# Patient Record
Sex: Male | Born: 1950 | Race: White | Hispanic: No | Marital: Married | Smoking: Former smoker
Health system: Southern US, Community
[De-identification: ages and names within clinical notes are randomized; demographics above are authoritative.]

## PROBLEM LIST (undated history)

## (undated) DIAGNOSIS — E785 Hyperlipidemia, unspecified: Secondary | ICD-10-CM

## (undated) DIAGNOSIS — I10 Essential (primary) hypertension: Secondary | ICD-10-CM

## (undated) DIAGNOSIS — M1990 Unspecified osteoarthritis, unspecified site: Secondary | ICD-10-CM

## (undated) DIAGNOSIS — E119 Type 2 diabetes mellitus without complications: Secondary | ICD-10-CM

## (undated) DIAGNOSIS — R011 Cardiac murmur, unspecified: Secondary | ICD-10-CM

## (undated) HISTORY — DX: Cardiac murmur, unspecified: R01.1

## (undated) HISTORY — PX: NO PAST SURGERIES: SHX2092

## (undated) HISTORY — DX: Hyperlipidemia, unspecified: E78.5

## (undated) HISTORY — DX: Unspecified osteoarthritis, unspecified site: M19.90

## (undated) HISTORY — DX: Essential (primary) hypertension: I10

## (undated) HISTORY — DX: Type 2 diabetes mellitus without complications: E11.9

---

## 2011-01-08 DIAGNOSIS — I10 Essential (primary) hypertension: Secondary | ICD-10-CM | POA: Insufficient documentation

## 2017-01-17 DIAGNOSIS — I491 Atrial premature depolarization: Secondary | ICD-10-CM | POA: Insufficient documentation

## 2018-01-01 DIAGNOSIS — R011 Cardiac murmur, unspecified: Secondary | ICD-10-CM | POA: Insufficient documentation

## 2018-01-15 LAB — LIPID PANEL
Cholesterol: 104 (ref 0–200)
HDL: 35 (ref 35–70)
LDL Cholesterol: 35
Triglycerides: 172 — AB (ref 40–160)

## 2018-01-15 LAB — BASIC METABOLIC PANEL
BUN: 18 (ref 4–21)
Creatinine: 1 (ref 0.6–1.3)
Glucose: 98
Potassium: 4 (ref 3.4–5.3)
Sodium: 141 (ref 137–147)

## 2018-01-15 LAB — HM HEPATITIS C SCREENING LAB: HM Hepatitis Screen: NEGATIVE

## 2018-02-20 DIAGNOSIS — I340 Nonrheumatic mitral (valve) insufficiency: Secondary | ICD-10-CM | POA: Insufficient documentation

## 2018-02-20 DIAGNOSIS — I361 Nonrheumatic tricuspid (valve) insufficiency: Secondary | ICD-10-CM | POA: Insufficient documentation

## 2018-02-20 DIAGNOSIS — I38 Endocarditis, valve unspecified: Secondary | ICD-10-CM | POA: Insufficient documentation

## 2018-05-09 LAB — MICROALBUMIN, URINE: Microalb, Ur: 59.6

## 2018-05-09 LAB — HEMOGLOBIN A1C: Hemoglobin A1C: 7.3

## 2018-05-19 DIAGNOSIS — I371 Nonrheumatic pulmonary valve insufficiency: Secondary | ICD-10-CM | POA: Insufficient documentation

## 2018-09-08 LAB — HM COLONOSCOPY

## 2019-02-26 ENCOUNTER — Other Ambulatory Visit: Payer: Self-pay

## 2019-02-26 ENCOUNTER — Encounter: Payer: Self-pay | Admitting: Family Medicine

## 2019-02-26 ENCOUNTER — Ambulatory Visit: Payer: Managed Care, Other (non HMO) | Admitting: Family Medicine

## 2019-02-26 VITALS — BP 144/78 | HR 104 | Temp 97.4°F | Ht 68.5 in | Wt 268.5 lb

## 2019-02-26 DIAGNOSIS — E785 Hyperlipidemia, unspecified: Secondary | ICD-10-CM

## 2019-02-26 DIAGNOSIS — E1100 Type 2 diabetes mellitus with hyperosmolarity without nonketotic hyperglycemic-hyperosmolar coma (NKHHC): Secondary | ICD-10-CM | POA: Diagnosis not present

## 2019-02-26 DIAGNOSIS — M25552 Pain in left hip: Secondary | ICD-10-CM | POA: Diagnosis not present

## 2019-02-26 DIAGNOSIS — R748 Abnormal levels of other serum enzymes: Secondary | ICD-10-CM | POA: Insufficient documentation

## 2019-02-26 DIAGNOSIS — I10 Essential (primary) hypertension: Secondary | ICD-10-CM

## 2019-02-26 LAB — HEMOGLOBIN A1C: Hgb A1c MFr Bld: 8.3 % — ABNORMAL HIGH (ref 4.6–6.5)

## 2019-02-26 LAB — COMPREHENSIVE METABOLIC PANEL
ALT: 43 U/L (ref 0–53)
AST: 28 U/L (ref 0–37)
Albumin: 4.4 g/dL (ref 3.5–5.2)
Alkaline Phosphatase: 60 U/L (ref 39–117)
BUN: 18 mg/dL (ref 6–23)
CO2: 25 mEq/L (ref 19–32)
Calcium: 9.7 mg/dL (ref 8.4–10.5)
Chloride: 103 mEq/L (ref 96–112)
Creatinine, Ser: 1.12 mg/dL (ref 0.40–1.50)
GFR: 65.4 mL/min (ref >60.00)
Glucose, Bld: 128 mg/dL — ABNORMAL HIGH (ref 70–99)
Potassium: 4.1 mEq/L (ref 3.5–5.1)
Sodium: 139 mEq/L (ref 135–145)
Total Bilirubin: 0.6 mg/dL (ref 0.2–1.2)
Total Protein: 7 g/dL (ref 6.0–8.3)

## 2019-02-26 LAB — LIPID PANEL
Cholesterol: 139 mg/dL (ref 0–200)
HDL: 48.4 mg/dL (ref >39.00)
NonHDL: 90.52
Total CHOL/HDL Ratio: 3
Triglycerides: 239 mg/dL — ABNORMAL HIGH (ref 0.0–149.0)
VLDL: 47.8 mg/dL — ABNORMAL HIGH (ref 0.0–40.0)

## 2019-02-26 LAB — LDL CHOLESTEROL, DIRECT: Direct LDL: 66 mg/dL

## 2019-02-26 LAB — CK: Total CK: 384 U/L — ABNORMAL HIGH (ref 7–232)

## 2019-02-26 MED ORDER — METOPROLOL SUCCINATE ER 25 MG PO TB24: 25.0000 mg | ORAL_TABLET | Freq: Every day | ORAL | 3 refills | Status: DC

## 2019-02-26 NOTE — Assessment & Plan Note (Signed)
DDx arthritis, bursitis, and piriformis syndrome. Given diabetes would like to avoid steroid injection if possible. Advised PT if patient can schedule. Hand out for piriformis exercises to try at home. Return if worsening no improvement - consider XR and steroid if more bursitis picture.

## 2019-02-26 NOTE — Assessment & Plan Note (Signed)
BP mildly elevated. Will get labs today and encouraged diet/exercise.

## 2019-02-26 NOTE — Patient Instructions (Signed)
Great to meet you today!  Get blood work today.   I would recommend physical therapy  Hand out with exercises
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
## 2019-02-26 NOTE — Assessment & Plan Note (Signed)
Noted to be >300 for a few years. Unclear why this was initially checked and patient unaware of diagnosis. Will repeat today as on statin to make sure not worsening.

## 2019-02-26 NOTE — Assessment & Plan Note (Signed)
Encouraged following diet. Will check HgbA1c today.

## 2019-03-04 ENCOUNTER — Encounter: Payer: Self-pay | Admitting: Gastroenterology

## 2019-03-10 ENCOUNTER — Other Ambulatory Visit: Payer: Self-pay

## 2019-03-10 MED ORDER — METFORMIN HCL 500 MG PO TABS: 500.0000 mg | ORAL_TABLET | Freq: Two times a day (BID) | ORAL | 2 refills | Status: AC

## 2019-04-08 ENCOUNTER — Telehealth: Payer: Self-pay | Admitting: *Deleted

## 2019-04-08 NOTE — Telephone Encounter (Signed)
Can discuss tomorrow at visit.
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
## 2019-04-08 NOTE — Telephone Encounter (Signed)
Left message for patient to call back to be advised 

## 2019-04-09 ENCOUNTER — Telehealth: Payer: Self-pay

## 2019-04-09 ENCOUNTER — Encounter: Payer: Self-pay | Admitting: Family Medicine

## 2019-04-09 ENCOUNTER — Ambulatory Visit: Payer: Managed Care, Other (non HMO) | Admitting: Family Medicine

## 2019-04-09 ENCOUNTER — Other Ambulatory Visit: Payer: Self-pay

## 2019-04-09 VITALS — BP 168/60 | HR 72 | Temp 98.0°F | Resp 10 | Ht 68.5 in | Wt 273.0 lb

## 2019-04-09 DIAGNOSIS — S161XXA Strain of muscle, fascia and tendon at neck level, initial encounter: Secondary | ICD-10-CM | POA: Diagnosis not present

## 2019-04-09 DIAGNOSIS — E1100 Type 2 diabetes mellitus with hyperosmolarity without nonketotic hyperglycemic-hyperosmolar coma (NKHHC): Secondary | ICD-10-CM

## 2019-04-09 DIAGNOSIS — I10 Essential (primary) hypertension: Secondary | ICD-10-CM

## 2019-04-09 DIAGNOSIS — I491 Atrial premature depolarization: Secondary | ICD-10-CM

## 2019-04-09 DIAGNOSIS — I499 Cardiac arrhythmia, unspecified: Secondary | ICD-10-CM | POA: Insufficient documentation

## 2019-04-09 MED ORDER — GABAPENTIN 300 MG PO CAPS: 300.0000 mg | ORAL_CAPSULE | Freq: Every day | ORAL | 3 refills | Status: DC

## 2019-04-09 MED ORDER — SITAGLIPTIN PHOSPHATE 100 MG PO TABS: 100.0000 mg | ORAL_TABLET | Freq: Every day | ORAL | 3 refills | Status: AC

## 2019-04-09 MED ORDER — METOPROLOL SUCCINATE ER 50 MG PO TB24: 50.0000 mg | ORAL_TABLET | Freq: Every day | ORAL | 3 refills | Status: AC
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
## 2019-04-09 NOTE — Patient Instructions (Signed)
#   High Blood pressure - increase metoprolol 50 mg - Call if you notice your blood pressure is still >150/90   Your blood pressure high.   High blood pressure increases your risk for heart attack and stroke.    Please check your blood pressure 2-4 times a week.   To check your blood pressure 1) Sit in a quiet and relaxed place for 5 minutes 2) Make sure your feet are flat on the ground 3) Consider checking first thing in the morning   Normal blood pressure is less than 140/90 Ideally you blood pressure should be around 120/80
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
## 2019-04-09 NOTE — Assessment & Plan Note (Addendum)
Pt with atrial bigeminity on EKG and frequent PAC. Reviewed cardiology note from Jan 2020 - they recommended 2 month f/u and decreased metoprolol last year. Discussed with patient and he is OK with increase in metoprolol - advised returning to cardiology given finding but he declined. Would like to see if there is improvement on high dose of metoprolol. HR 68 today so will need to monitor HR prior to any other increases. Of note in cardiology note they discussed sleep apnea as possible risk. Will send patient mychart to advise referral.

## 2019-04-09 NOTE — Assessment & Plan Note (Signed)
Continued to encourage diet/exercise. Cont medication return 2 months

## 2019-04-09 NOTE — Telephone Encounter (Signed)
Pt left v/m would be by around 3:15 today to pick up work badge.

## 2019-04-09 NOTE — Telephone Encounter (Signed)
I left message for patient letting him know that he left his work badge in the room. I have this on my desk when he comes back by.

## 2019-04-21 ENCOUNTER — Other Ambulatory Visit: Payer: Self-pay

## 2019-04-21 MED ORDER — SIMVASTATIN 20 MG PO TABS: 20.0000 mg | ORAL_TABLET | Freq: Every day | ORAL | 1 refills | Status: DC

## 2019-06-02 ENCOUNTER — Ambulatory Visit: Payer: Managed Care, Other (non HMO) | Admitting: Family Medicine

## 2019-06-08 ENCOUNTER — Other Ambulatory Visit: Payer: Self-pay

## 2019-06-08 ENCOUNTER — Ambulatory Visit: Payer: Managed Care, Other (non HMO) | Admitting: Family Medicine

## 2019-06-08 ENCOUNTER — Encounter: Payer: Self-pay | Admitting: Family Medicine

## 2019-06-08 VITALS — BP 138/56 | HR 50 | Temp 97.8°F | Resp 24 | Ht 68.5 in | Wt 276.8 lb

## 2019-06-08 DIAGNOSIS — L608 Other nail disorders: Secondary | ICD-10-CM

## 2019-06-08 DIAGNOSIS — G4733 Obstructive sleep apnea (adult) (pediatric): Secondary | ICD-10-CM

## 2019-06-08 DIAGNOSIS — E1100 Type 2 diabetes mellitus with hyperosmolarity without nonketotic hyperglycemic-hyperosmolar coma (NKHHC): Secondary | ICD-10-CM

## 2019-06-08 DIAGNOSIS — B351 Tinea unguium: Secondary | ICD-10-CM

## 2019-06-08 DIAGNOSIS — I10 Essential (primary) hypertension: Secondary | ICD-10-CM

## 2019-06-08 LAB — POCT GLYCOSYLATED HEMOGLOBIN (HGB A1C): Hemoglobin A1C: 7.7 % — AB (ref 4.0–5.6)

## 2019-06-08 LAB — MICROALBUMIN / CREATININE URINE RATIO
Creatinine,U: 107.9 mg/dL
Microalb Creat Ratio: 23 mg/g (ref 0.0–30.0)
Microalb, Ur: 24.8 mg/dL — ABNORMAL HIGH (ref 0.0–1.9)

## 2019-06-08 NOTE — Patient Instructions (Addendum)
#  Diabetes - Metformin increase Tomorrow: Take 1000 mg (2 tablets) in the morning and 500 mg in the evening with food Week 2: Take 1000 mg (2 tablets) in the morning and evening with food  - let me know what dose you get to for refills  Return in 3 months for an appointment.   #Blood pressure - continue current medications - sleep apnea referral  #Referral I have placed a referral to a specialist for you. You should receive a phone call from the specialty office. Make sure your voicemail is not full and that if you are able to answer your phone to unknown or new numbers.   It may take up to 2 weeks to hear about the referral. If you do not hear anything in 2 weeks, please call our office and ask to speak with the referral coordinator.    Stop the meloxicam daily -- can take as needed for pain
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
## 2019-06-08 NOTE — Assessment & Plan Note (Signed)
Improved but still elevated. Increase metformin as tolerated to 1000 mg BID. Cont Januvia. Recheck 3 months

## 2019-06-08 NOTE — Progress Notes (Signed)
Subjective:     Ricardo Jacobs is a 69 y.o. male presenting for Diabetes (follow up) and Hypertension (follow up)     HPI   #HTN - took the higher dose of metoprolol and amlodpine - no palpations, no cp - had been evaluated for sleep apnea about 10 years ago and had a machine and he stopped due to feeling better - sleep is fine now - no issues with sleepiness during the day - working to reduce salt   #Diabetes Currently taking metformin 500 mg BID and januvia  Using medications without difficulties: No Hypoglycemic episodes:No  Hyperglycemic episodes:No  Feet problems: hx of tingling but none currently Diet: tries to follow low sugar diet Last HgbA1c:  Lab Results  Component Value Date   HGBA1C 7.7 (A) 06/08/2019   Black nail: x 1 month  Diabetes Health Maintenance Due:    Diabetes Health Maintenance Due  Topic Date Due  . OPHTHALMOLOGY EXAM  Never done  . URINE MICROALBUMIN  05/09/2019  . HEMOGLOBIN A1C  12/08/2019  . FOOT EXAM  06/07/2020      Review of Systems  Chart review 04/09/2019: Clinic - HTN - increase metoprolol due to PACs. DM - januvia. Atrial bigeminity - encouraged cards, but declined. Increase metoprolol.   Social History   Tobacco Use  Smoking Status Former Smoker  . Packs/day: 1.00  . Years: 8.00  . Pack years: 8.00  . Types: Cigarettes, Cigars  . Quit date: 80  . Years since quitting: 36.3  Smokeless Tobacco Never Used        Objective:    BP Readings from Last 3 Encounters:  06/08/19 (!) 138/56  04/09/19 (!) 168/60  02/26/19 (!) 144/78   Wt Readings from Last 3 Encounters:  06/08/19 276 lb 12 oz (125.5 kg)  04/09/19 273 lb (123.8 kg)  02/26/19 268 lb 8 oz (121.8 kg)    BP (!) 138/56   Pulse (!) 50   Temp 97.8 F (36.6 C)   Resp (!) 24   Ht 5' 8.5" (1.74 m)   Wt 276 lb 12 oz (125.5 kg)   SpO2 96%   BMI 41.47 kg/m    Physical Exam Constitutional:      Appearance: Normal appearance. He is not  ill-appearing or diaphoretic.  HENT:     Right Ear: External ear normal.     Left Ear: External ear normal.     Nose: Nose normal.  Eyes:     General: No scleral icterus.    Extraocular Movements: Extraocular movements intact.     Conjunctiva/sclera: Conjunctivae normal.  Cardiovascular:     Rate and Rhythm: Normal rate and regular rhythm.     Heart sounds: No murmur.  Pulmonary:     Effort: Pulmonary effort is normal. No respiratory distress.     Breath sounds: Normal breath sounds. No wheezing.  Musculoskeletal:     Cervical back: Neck supple.  Skin:    General: Skin is warm and dry.  Neurological:     Mental Status: He is alert. Mental status is at baseline.  Psychiatric:        Mood and Affect: Mood normal.           Assessment & Plan:   Problem List Items Addressed This Visit      Cardiovascular and Mediastinum   HTN (hypertension)    BP elevated but palpitations improved. Stop NSAID. Sleep apnea evaluation and return if 2 months. Discussed adding another medication today, but  he declined. If unable to get sleep apnea eval quickly will likely add medication. Cont metoprolol and amlodipine.         Respiratory   Obstructive sleep apnea syndrome    Hx and treatment 10 years ago. Suspect this could be worsening HTN. Advised return evaluation and treatment. Referral placed given length of time.       Relevant Orders   Ambulatory referral to Pulmonology     Endocrine   Type 2 diabetes mellitus with hyperosmolarity without coma, without long-term current use of insulin (HCC) - Primary    Improved but still elevated. Increase metformin as tolerated to 1000 mg BID. Cont Januvia. Recheck 3 months      Relevant Orders   POCT glycosylated hemoglobin (Hb A1C) (Completed)   Microalbumin/Creatinine Ratio, Urine     Musculoskeletal and Integument   Onychomycosis    Not bothersome and not interested in treatment.         Other   Black nails    No know trauma but  as only present x 1 month, advised monitoring and if not resolved in 2 months to call and can do dermatology referral           Return in about 3 months (around 09/07/2019) for DM and HTN.  Lynnda Child, MD

## 2019-06-08 NOTE — Assessment & Plan Note (Signed)
BP elevated but palpitations improved. Stop NSAID. Sleep apnea evaluation and return if 2 months. Discussed adding another medication today, but he declined. If unable to get sleep apnea eval quickly will likely add medication. Cont metoprolol and amlodipine.

## 2019-06-08 NOTE — Assessment & Plan Note (Signed)
No know trauma but as only present x 1 month, advised monitoring and if not resolved in 2 months to call and can do dermatology referral

## 2019-06-08 NOTE — Assessment & Plan Note (Signed)
Not bothersome and not interested in treatment.

## 2019-09-07 ENCOUNTER — Encounter: Payer: Self-pay | Admitting: Family Medicine

## 2019-09-07 ENCOUNTER — Ambulatory Visit: Payer: Managed Care, Other (non HMO) | Admitting: Family Medicine

## 2019-09-07 ENCOUNTER — Other Ambulatory Visit: Payer: Self-pay

## 2019-09-07 VITALS — BP 136/60 | HR 101 | Temp 97.6°F | Wt 271.2 lb

## 2019-09-07 DIAGNOSIS — M25572 Pain in left ankle and joints of left foot: Secondary | ICD-10-CM

## 2019-09-07 DIAGNOSIS — G8929 Other chronic pain: Secondary | ICD-10-CM

## 2019-09-07 DIAGNOSIS — I10 Essential (primary) hypertension: Secondary | ICD-10-CM | POA: Diagnosis not present

## 2019-09-07 DIAGNOSIS — E1100 Type 2 diabetes mellitus with hyperosmolarity without nonketotic hyperglycemic-hyperosmolar coma (NKHHC): Secondary | ICD-10-CM | POA: Diagnosis not present

## 2019-09-07 LAB — POCT GLYCOSYLATED HEMOGLOBIN (HGB A1C): Hemoglobin A1C: 8.4 % — AB (ref 4.0–5.6)

## 2019-09-07 NOTE — Assessment & Plan Note (Signed)
Worse. Discussed that next agent may need to be injectable or insulin. Pt working on weight loss but discussed need for low carb diabetic diet. Emphasis on nutrition - he declined referral. He will work to improve diet and follow-up in 3 months. Cont Januvia, Metformin, glipizide. May benefit from Oxempic as additional agent

## 2019-09-07 NOTE — Assessment & Plan Note (Signed)
Some ttp but no known injury. Suspect sprain or arthritis. Advised topical NSAID and PT if not improving. Consider XR but given intermittent symptoms and no injury decided to defer

## 2019-09-07 NOTE — Progress Notes (Signed)
Subjective:     Ricardo Jacobs is a 69 y.o. male presenting for Follow-up (3 month- DM) and Leg Pain (left leg from hip to ankle )     HPI   #Diabetes Currently taking Metformin 1000 mg BID, glipizide 5 mg BID, and Januvia 100 mg Diet: tries to watch what he eats - does eat salad, is eating carbs  Using medications without difficulties: No Hypoglycemic episodes:No  Hyperglycemic episodes:No  Feet problems:No  Blood Sugars averaging: does not check at home Last HgbA1c:  Recent Labs       Lab Results  Component Value Date   HGBA1C 8.4 (A) 09/07/2019     Has cut back on fastfood places  Diabetes Health Maintenance Due:        Diabetes Health Maintenance Due  Topic Date Due  . OPHTHALMOLOGY EXAM  Never done  . HEMOGLOBIN A1C  03/09/2020  . FOOT EXAM  06/07/2020  . URINE MICROALBUMIN  06/07/2020    Left leg pain - starts at the ankle and travels to the buttock - denies back pain - does have some back stiffness in the AM - tries to baby this leg to avoid symptoms  - leg pain - denies knee pain - pain is shooting in nature and quick on and quick off when it occurs - will take ibuprofen or ASA occasionally which will help the symptoms - no weakness/tingling/numbness   Review of Systems  06/08/2019: Clinic - HTN and Bradycardia - Sleep apnea referral. DM  - 7.7 increase metformin Social History   Tobacco Use  Smoking Status Former Smoker  . Packs/day: 1.00  . Years: 8.00  . Pack years: 8.00  . Types: Cigarettes, Cigars  . Quit date: 32  . Years since quitting: 36.5  Smokeless Tobacco Never Used        Objective:    BP Readings from Last 3 Encounters:  09/07/19 (!) 136/60  06/08/19 (!) 138/56  04/09/19 (!) 168/60   Wt Readings from Last 3 Encounters:  09/07/19 (!) 271 lb 4 oz (123 kg)  06/08/19 276 lb 12 oz (125.5 kg)  04/09/19 273 lb (123.8 kg)    BP (!) 136/60   Pulse 101   Temp 97.6 F (36.4 C) (Temporal)   Wt (!) 271 lb 4 oz  (123 kg)   SpO2 96%   BMI 40.64 kg/m    Physical Exam Constitutional:      Appearance: Normal appearance. He is obese. He is not ill-appearing or diaphoretic.  HENT:     Right Ear: External ear normal.     Left Ear: External ear normal.  Eyes:     General: No scleral icterus.    Extraocular Movements: Extraocular movements intact.     Conjunctiva/sclera: Conjunctivae normal.  Cardiovascular:     Rate and Rhythm: Normal rate.  Pulmonary:     Effort: Pulmonary effort is normal.  Musculoskeletal:     Cervical back: Neck supple.     Comments: B/l LE trace edema. Left ankle - TTP along the medial malleolus and up the leg. Normal ROM and strength. No erythema or bruising.   Skin:    General: Skin is warm and dry.  Neurological:     Mental Status: He is alert. Mental status is at baseline.  Psychiatric:        Mood and Affect: Mood normal.           Assessment & Plan:   Problem List Items Addressed This Visit  Cardiovascular and Mediastinum   HTN (hypertension)    Stable on amlodipine. May need to discuss switching to ACE-I for kidney protection. Limit NSAIDs        Endocrine   Type 2 diabetes mellitus with hyperosmolarity without coma, without long-term current use of insulin (HCC) - Primary    Worse. Discussed that next agent may need to be injectable or insulin. Pt working on weight loss but discussed need for low carb diabetic diet. Emphasis on nutrition - he declined referral. He will work to improve diet and follow-up in 3 months. Cont Januvia, Metformin, glipizide. May benefit from Oxempic as additional agent      Relevant Orders   POCT glycosylated hemoglobin (Hb A1C) (Completed)     Other   Chronic pain of left ankle    Some ttp but no known injury. Suspect sprain or arthritis. Advised topical NSAID and PT if not improving. Consider XR but given intermittent symptoms and no injury decided to defer      Relevant Medications   ibuprofen (ADVIL) 400 MG  tablet       Return in about 3 months (around 12/08/2019).  Lynnda Child, MD  This visit occurred during the SARS-CoV-2 public health emergency.  Safety protocols were in place, including screening questions prior to the visit, additional usage of staff PPE, and extensive cleaning of exam room while observing appropriate contact time as indicated for disinfecting solutions.

## 2019-09-07 NOTE — Patient Instructions (Signed)
#  Diabetes  - go over your resources and see how you can decrease carbs in your diet - continue medications - return in 3 months - call if you decide you want a nutritionist appointment  #Leg pain - try voltaren gel - if no improvement in 1 month call and will refer to physical therapy

## 2019-09-07 NOTE — Assessment & Plan Note (Signed)
Stable on amlodipine. May need to discuss switching to ACE-I for kidney protection. Limit NSAIDs

## 2019-10-01 ENCOUNTER — Ambulatory Visit: Payer: Managed Care, Other (non HMO) | Admitting: Family Medicine

## 2019-10-01 ENCOUNTER — Other Ambulatory Visit: Payer: Self-pay

## 2019-10-01 ENCOUNTER — Ambulatory Visit (INDEPENDENT_AMBULATORY_CARE_PROVIDER_SITE_OTHER)
Admission: RE | Admit: 2019-10-01 | Discharge: 2019-10-01 | Disposition: A | Discharge status: Home / Self Care | Payer: Managed Care, Other (non HMO) | Source: Ambulatory Visit | Attending: Family Medicine | Admitting: Family Medicine

## 2019-10-01 ENCOUNTER — Encounter: Payer: Self-pay | Admitting: Family Medicine

## 2019-10-01 VITALS — BP 158/60 | HR 58 | Temp 97.8°F | Wt 278.5 lb

## 2019-10-01 DIAGNOSIS — E1100 Type 2 diabetes mellitus with hyperosmolarity without nonketotic hyperglycemic-hyperosmolar coma (NKHHC): Secondary | ICD-10-CM

## 2019-10-01 DIAGNOSIS — M5442 Lumbago with sciatica, left side: Secondary | ICD-10-CM

## 2019-10-01 MED ORDER — GABAPENTIN 300 MG PO CAPS: 300.0000 mg | ORAL_CAPSULE | Freq: Every day | ORAL | 3 refills | Status: AC

## 2019-10-01 MED ORDER — CYCLOBENZAPRINE HCL 5 MG PO TABS: 5.0000 mg | ORAL_TABLET | Freq: Three times a day (TID) | ORAL | 1 refills | Status: AC | PRN

## 2019-10-01 MED ORDER — PREDNISONE 20 MG PO TABS: ORAL_TABLET | ORAL | 0 refills | Status: AC

## 2019-10-01 NOTE — Patient Instructions (Signed)
Do not take meloxicam or Ibuprofen or aleve when on prednisone  Muscle relaxer -- for muscle spasms, tightening muscles -- try at nighttime can make sleepy  Prednisone - take daily in the morning for 15 days  X-ray before you leave  If you develop - new or worsening weakness, loss of bowel or bladder control -- call as this is an emergency   Physical therapy referral

## 2019-10-01 NOTE — Assessment & Plan Note (Signed)
Refill on gabapentin °

## 2019-10-01 NOTE — Progress Notes (Signed)
Subjective:     Ricardo Jacobs is a 69 y.o. male presenting for Back Pain (lumbar x 6 week ) and Leg Pain (left x 6 weeks )     HPI   #Left leg pain - from the hip down - cannot walk at the store due to severe pain - improves with pushing a shopping cart - lumbar back pain - radiates down the leg - no issues with urination/bm - symptoms are worsening - treatment - NSAIDs w/ improvement, looking for some arthritis treatment  - has tried different medication - has not tried any stretches/exercises - worse with walking -    Review of Systems   Social History   Tobacco Use  Smoking Status Former Smoker  . Packs/day: 1.00  . Years: 8.00  . Pack years: 8.00  . Types: Cigarettes, Cigars  . Quit date: 63  . Years since quitting: 36.6  Smokeless Tobacco Never Used        Objective:    BP Readings from Last 3 Encounters:  10/01/19 (!) 158/60  09/07/19 (!) 136/60  06/08/19 (!) 138/56   Wt Readings from Last 3 Encounters:  10/01/19 278 lb 8 oz (126.3 kg)  09/07/19 (!) 271 lb 4 oz (123 kg)  06/08/19 276 lb 12 oz (125.5 kg)    BP (!) 158/60   Pulse (!) 58   Temp 97.8 F (36.6 C) (Temporal)   Wt 278 lb 8 oz (126.3 kg)   SpO2 96%   BMI 41.73 kg/m    Physical Exam Constitutional:      Appearance: Normal appearance. He is not ill-appearing or diaphoretic.  HENT:     Right Ear: External ear normal.     Left Ear: External ear normal.  Eyes:     General: No scleral icterus.    Extraocular Movements: Extraocular movements intact.     Conjunctiva/sclera: Conjunctivae normal.  Cardiovascular:     Rate and Rhythm: Normal rate.  Pulmonary:     Effort: Pulmonary effort is normal.  Musculoskeletal:     Cervical back: Neck supple.     Comments: Back: Inspection: no abnormalities Palpation: TTP along the left paraspinous ROM: decreased extension, pain with flexion, and rotation and lateral flexion Strength: decreased left hip flexor. Normal knee  extension/flexion, normal dorsiflexion/extension.  Straight leg raise and contralateral leg raise positive.   Skin:    General: Skin is warm and dry.  Neurological:     Mental Status: He is alert. Mental status is at baseline.     Deep Tendon Reflexes:     Reflex Scores:      Patellar reflexes are 2+ on the right side and 3+ on the left side. Psychiatric:        Mood and Affect: Mood normal.           Assessment & Plan:   Problem List Items Addressed This Visit      Endocrine   Type 2 diabetes mellitus with hyperosmolarity without coma, without long-term current use of insulin (HCC)    Refill on gabapentin.       Relevant Medications   gabapentin (NEURONTIN) 300 MG capsule     Other   Acute left-sided low back pain with left-sided sciatica - Primary    Symptoms x 6 weeks with left side hyper-reflexia and some hip flexor weakness. No loss of control of bowel or bladder but symptoms improved on shopping cart concerning for possible stenosis. XR today - will f/u final read. Discussed  MRI now vs after PT and pt will try medication and PT first. Discussed red flag symptoms and to call if new/worsening weakness. Prednisone (no NSAIDs while taking) and muscle relaxant       Relevant Medications   predniSONE (DELTASONE) 20 MG tablet   cyclobenzaprine (FLEXERIL) 5 MG tablet   Other Relevant Orders   DG Lumbar Spine Complete       Return in about 6 weeks (around 11/12/2019).  Lynnda Child, MD  This visit occurred during the SARS-CoV-2 public health emergency.  Safety protocols were in place, including screening questions prior to the visit, additional usage of staff PPE, and extensive cleaning of exam room while observing appropriate contact time as indicated for disinfecting solutions.

## 2019-10-01 NOTE — Assessment & Plan Note (Addendum)
Symptoms x 6 weeks with left side hyper-reflexia and some hip flexor weakness. No loss of control of bowel or bladder but symptoms improved on shopping cart concerning for possible stenosis. XR today - will f/u final read. Discussed MRI now vs after PT and pt will try medication and PT first. Discussed red flag symptoms and to call if new/worsening weakness. Prednisone (no NSAIDs while taking) and muscle relaxant

## 2019-10-04 ENCOUNTER — Other Ambulatory Visit: Payer: Self-pay | Admitting: Family Medicine

## 2019-11-26 ENCOUNTER — Other Ambulatory Visit: Payer: Self-pay | Admitting: *Deleted

## 2019-11-26 MED ORDER — AMLODIPINE BESYLATE 10 MG PO TABS: 10.0000 mg | ORAL_TABLET | Freq: Every day | ORAL | 1 refills | Status: AC

## 2019-11-26 MED ORDER — GLIPIZIDE 5 MG PO TABS: 5.0000 mg | ORAL_TABLET | Freq: Two times a day (BID) | ORAL | 1 refills | Status: AC

## 2020-01-04 ENCOUNTER — Other Ambulatory Visit: Payer: Self-pay | Admitting: Family Medicine

## 2020-03-18 ENCOUNTER — Other Ambulatory Visit: Payer: Self-pay | Admitting: Family Medicine

## 2020-03-18 DIAGNOSIS — E1100 Type 2 diabetes mellitus with hyperosmolarity without nonketotic hyperglycemic-hyperosmolar coma (NKHHC): Secondary | ICD-10-CM

## 2020-04-04 ENCOUNTER — Other Ambulatory Visit: Payer: Self-pay | Admitting: Family Medicine

## 2020-11-12 DEATH — deceased

## 2021-08-03 IMAGING — DX DG LUMBAR SPINE COMPLETE 4+V
5 series · 5 of 5 positions shown · non-contrast
Comparison: None.

CLINICAL DATA: Back pain.

EXAM:
LUMBAR SPINE - COMPLETE 4+ VIEW

[l-spine ap]
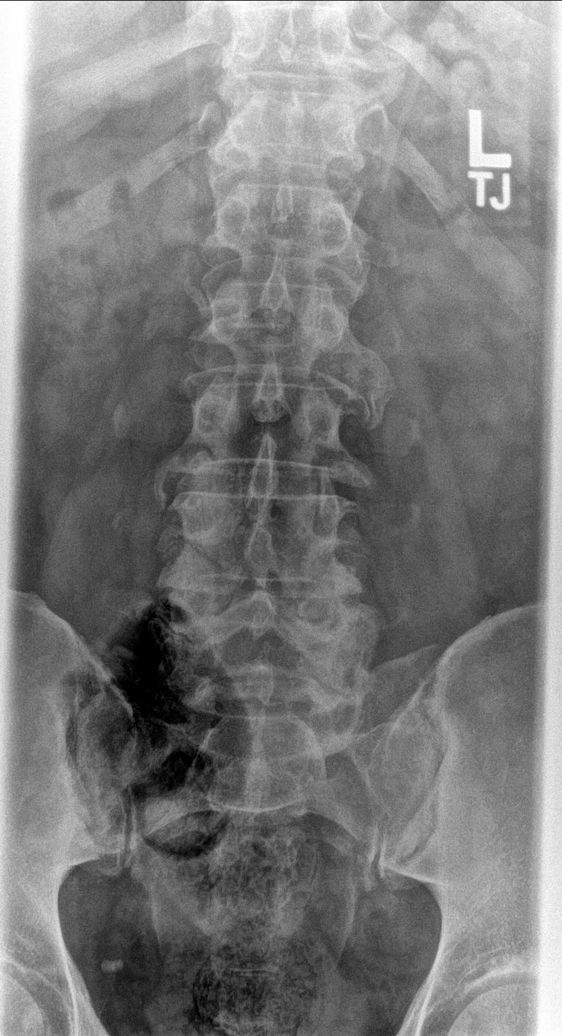

[l-spine obl (1 of 2)]
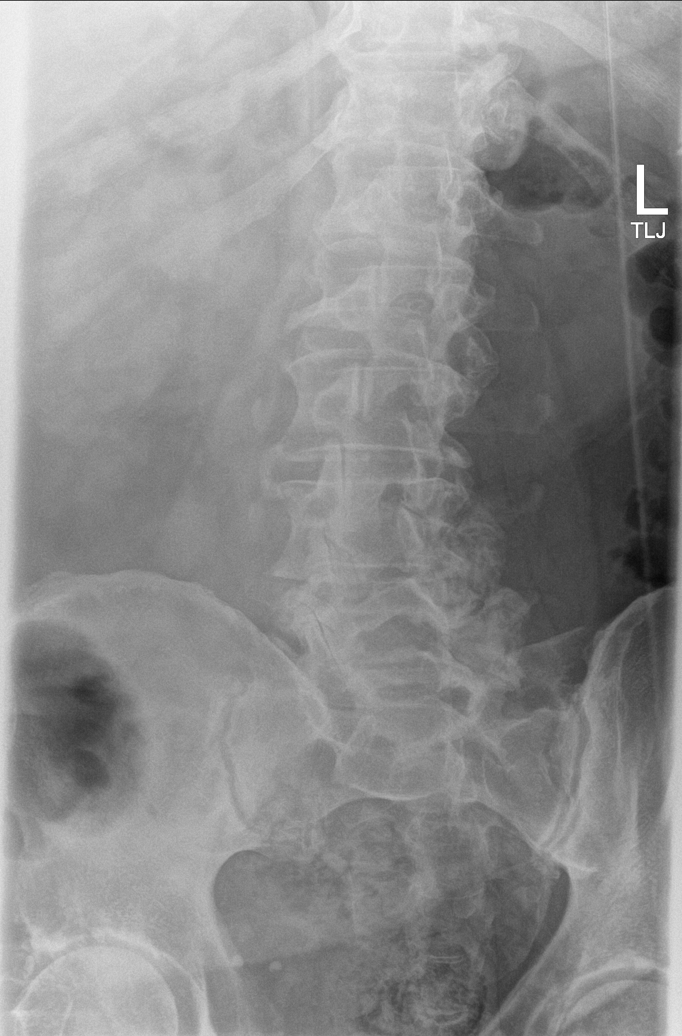

[l-spine obl (2 of 2)]
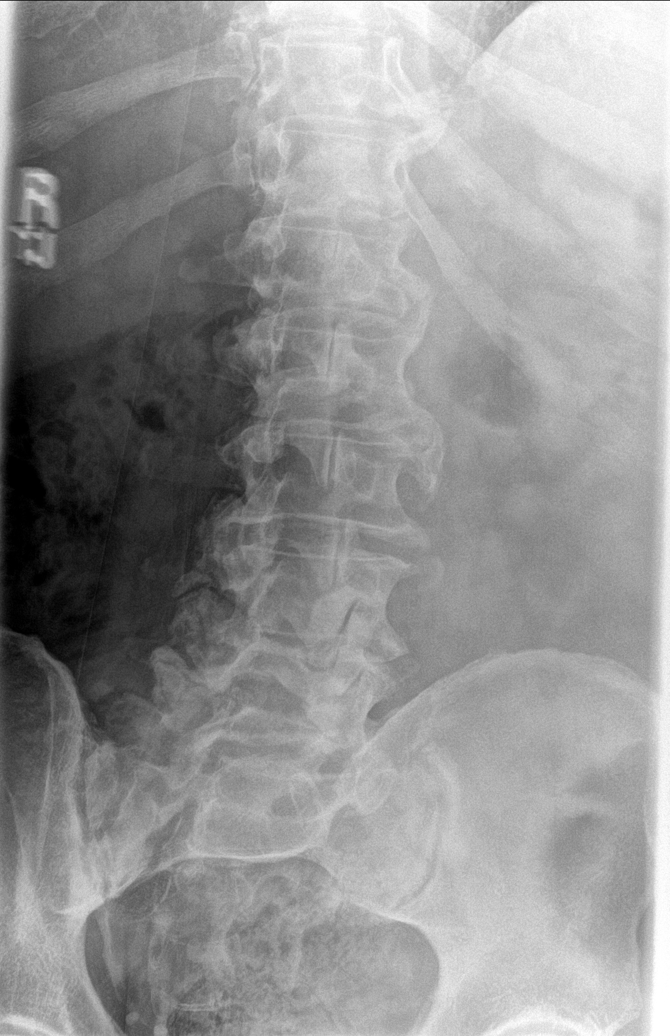

[l-spine lat]
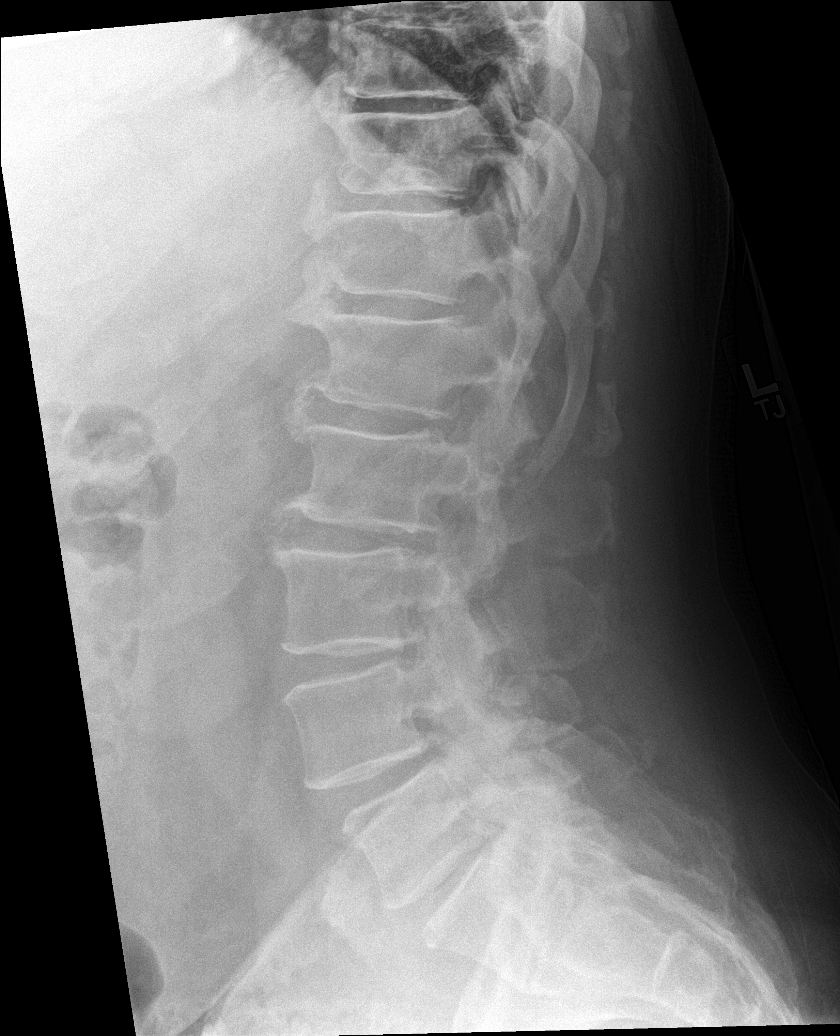

[l-spine l5/s1]
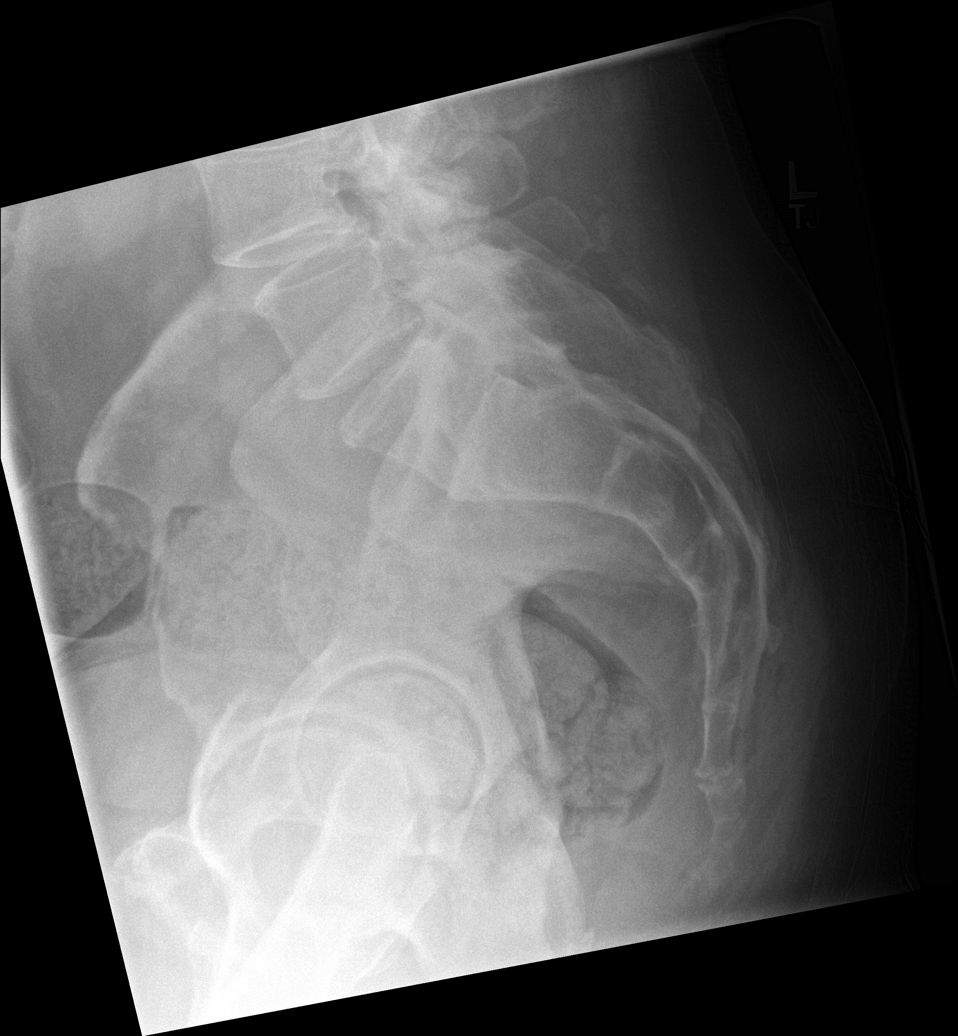

[5 of 5 positions shown; findings below may reference images not displayed]

FINDINGS: Moderate degenerative lumbar spondylosis with multilevel disc
disease and facet disease. Near bridging osteophytes involving the
lower thoracic spine and upper lumbar spine.

Very mild degenerative anterolisthesis of L4 and L3.

No acute bony findings or worrisome bone lesions.

The visualized bony pelvis is intact.
IMPRESSION: Moderate degenerative lumbar spondylosis with multilevel disc
disease and facet disease.
